# Patient Record
Sex: Female | Born: 1970 | Race: Black or African American | Hispanic: No | Marital: Single | Smoking: Never smoker
Health system: Southern US, Community
[De-identification: ages and names within clinical notes are randomized; demographics above are authoritative.]

## PROBLEM LIST (undated history)

## (undated) DIAGNOSIS — E119 Type 2 diabetes mellitus without complications: Secondary | ICD-10-CM

---

## 2019-10-20 ENCOUNTER — Emergency Department
Admission: EM | Admit: 2019-10-20 | Discharge: 2019-10-20 | Disposition: A | Discharge status: Home / Self Care | Payer: Managed Care, Other (non HMO) | Attending: Emergency Medicine | Admitting: Emergency Medicine

## 2019-10-20 ENCOUNTER — Other Ambulatory Visit: Payer: Self-pay

## 2019-10-20 ENCOUNTER — Encounter: Payer: Self-pay | Admitting: Emergency Medicine

## 2019-10-20 ENCOUNTER — Emergency Department: Payer: Managed Care, Other (non HMO)

## 2019-10-20 DIAGNOSIS — R569 Unspecified convulsions: Secondary | ICD-10-CM | POA: Diagnosis not present

## 2019-10-20 HISTORY — DX: Type 2 diabetes mellitus without complications: E11.9

## 2019-10-20 LAB — CBC WITH DIFFERENTIAL/PLATELET
Abs Immature Granulocytes: 0.04 10*3/uL (ref 0.00–0.07)
Basophils Absolute: 0 10*3/uL (ref 0.0–0.1)
Basophils Relative: 0 %
Eosinophils Absolute: 0.2 10*3/uL (ref 0.0–0.5)
Eosinophils Relative: 2 %
HCT: 38.2 % (ref 36.0–46.0)
Hemoglobin: 11.8 g/dL — ABNORMAL LOW (ref 12.0–15.0)
Immature Granulocytes: 0 %
Lymphocytes Relative: 51 %
Lymphs Abs: 4.9 10*3/uL — ABNORMAL HIGH (ref 0.7–4.0)
MCH: 25.9 pg — ABNORMAL LOW (ref 26.0–34.0)
MCHC: 30.9 g/dL (ref 30.0–36.0)
MCV: 84 fL (ref 80.0–100.0)
Monocytes Absolute: 0.9 10*3/uL (ref 0.1–1.0)
Monocytes Relative: 9 %
Neutro Abs: 3.7 10*3/uL (ref 1.7–7.7)
Neutrophils Relative %: 38 %
Platelets: 246 10*3/uL (ref 150–400)
RBC: 4.55 MIL/uL (ref 3.87–5.11)
RDW: 15.3 % (ref 11.5–15.5)
WBC: 9.6 10*3/uL (ref 4.0–10.5)
nRBC: 0 % (ref 0.0–0.2)

## 2019-10-20 LAB — COMPREHENSIVE METABOLIC PANEL
ALT: 23 U/L (ref 0–44)
AST: 31 U/L (ref 15–41)
Albumin: 4.2 g/dL (ref 3.5–5.0)
Alkaline Phosphatase: 81 U/L (ref 38–126)
Anion gap: 15 (ref 5–15)
BUN: 15 mg/dL (ref 6–20)
CO2: 22 mmol/L (ref 22–32)
Calcium: 9.5 mg/dL (ref 8.9–10.3)
Chloride: 105 mmol/L (ref 98–111)
Creatinine, Ser: 0.89 mg/dL (ref 0.44–1.00)
GFR calc Af Amer: >60 mL/min (ref >60)
GFR calc non Af Amer: >60 mL/min (ref >60)
Glucose, Bld: 112 mg/dL — ABNORMAL HIGH (ref 70–99)
Potassium: 3.9 mmol/L (ref 3.5–5.1)
Sodium: 142 mmol/L (ref 135–145)
Total Bilirubin: 0.4 mg/dL (ref 0.3–1.2)
Total Protein: 7.5 g/dL (ref 6.5–8.1)

## 2019-10-20 LAB — ETHANOL: Alcohol, Ethyl (B): <10 mg/dL (ref <10)

## 2019-10-20 LAB — GLUCOSE, CAPILLARY: Glucose-Capillary: 92 mg/dL (ref 70–99)

## 2019-10-20 NOTE — ED Provider Notes (Signed)
  ER Provider Note       Time seen: 4:40 PM   Level V caveat: History/ROS limited by altered mental status I have reviewed the vital signs and the nursing notes.  HISTORY   Chief Complaint No chief complaint on file.    HPI Carrie French is a 49 y.o. female with no known past medical history who presents today for seizure-like activity.  Patient was sleeping in the back of a vehicle driven by her sister and her sister's husband.  She was thought to be having a seizure.  Family noted that she was drooling a lot, she had bitten her lower lip.  It is unknown if she has a history of seizures.  No other information is available.  No past medical history on file.  Allergies Patient has no allergy information on record.  Review of Systems Review of systems otherwise unknown Neurological: Positive for possible seizure  All systems negative/normal/unremarkable except as stated in the HPI  ____________________________________________   PHYSICAL EXAM:  VITAL SIGNS: There were no vitals filed for this visit.  Constitutional: Alert but disoriented.  No obvious distress Eyes: Conjunctivae are normal. Normal extraocular movements. ENT      Head: Normocephalic and atraumatic.      Nose: No congestion/rhinnorhea.      Mouth/Throat: Mucous membranes are moist.,  Right sided lower lip laceration is noted      Neck: No stridor. Cardiovascular: Normal rate, regular rhythm. No murmurs, rubs, or gallops. Respiratory: Normal respiratory effort without tachypnea nor retractions. Breath sounds are clear and equal bilaterally. No wheezes/rales/rhonchi. Gastrointestinal: Soft and nontender. Normal bowel sounds Musculoskeletal: Nontender with normal range of motion in extremities. No lower extremity tenderness nor edema. Neurologic:  Normal speech and language. No gross focal neurologic deficits are appreciated.  Skin:  Skin is warm, dry and intact. No rash noted. Psychiatric: Speech and  behavior are normal.  ____________________________________________   LABS (pertinent positives/negatives)  Labs Reviewed  CBC WITH DIFFERENTIAL/PLATELET - Abnormal; Notable for the following components:      Result Value   Hemoglobin 11.8 (*)    MCH 25.9 (*)    Lymphs Abs 4.9 (*)    All other components within normal limits  COMPREHENSIVE METABOLIC PANEL - Abnormal; Notable for the following components:   Glucose, Bld 112 (*)    All other components within normal limits  GLUCOSE, CAPILLARY  ETHANOL  URINALYSIS, COMPLETE (UACMP) WITH MICROSCOPIC  URINE DRUG SCREEN, QUALITATIVE ([facility] ONLY)  CBG MONITORING, ED    RADIOLOGY  Images were viewed by me CT head IMPRESSION: 1. No acute intracranial process.  DIFFERENTIAL DIAGNOSIS  Seizure, pseudoseizure, arrhythmia, syncope, dehydration, electrolyte abnormality  ASSESSMENT AND PLAN  Seizure like activity   Plan: The patient had presented for seizure-like activity. Patient's labs are unremarkable, she was unable to provide a urine specimen.  No clear etiology for potential seizure.  She will be referred to neurology for outpatient follow-up.  Daryel November MD    Note: This note was generated in part or whole with voice recognition software. Voice recognition is usually quite accurate but there are transcription errors that can and very often do occur. I apologize for any typographical errors that were not detected and corrected.      Emily Filbert, MD 10/20/19 2020

## 2019-10-20 NOTE — ED Triage Notes (Signed)
Pt from home via POV. Sister drove pt to ED, per sister pt "had a seizure while in the car and drove here". St seizure last ' ".   ED Staff pulled pt out of her POV onto stretcher and brougth back to 12H.  Pt arrives postictal with no previous hx of seizures. Bit her tongue. EDP Williams at bedside.  Pt disoriented upon arrival.

## 2021-01-08 IMAGING — CT CT HEAD W/O CM
3 series · 16 of 47 positions shown, 19 images · non-contrast
Comparison: None.

CLINICAL DATA: Encephalopathy, confusion

EXAM:
CT HEAD WITHOUT CONTRAST
TECHNIQUE: Contiguous axial images were obtained from the base of the skull
through the vertex without intravenous contrast.

[Series 2: head wo · axial · 0.47mm/px · z∈[-141,-16]mm · 10 of 30 slices shown, 13 images]
[im 3/30  brain]
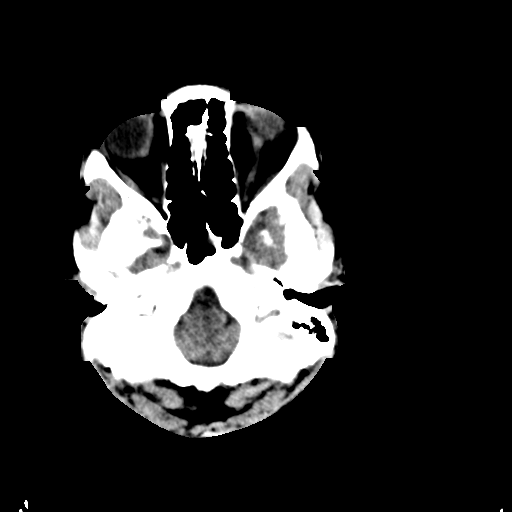
[im 3/30  bone]
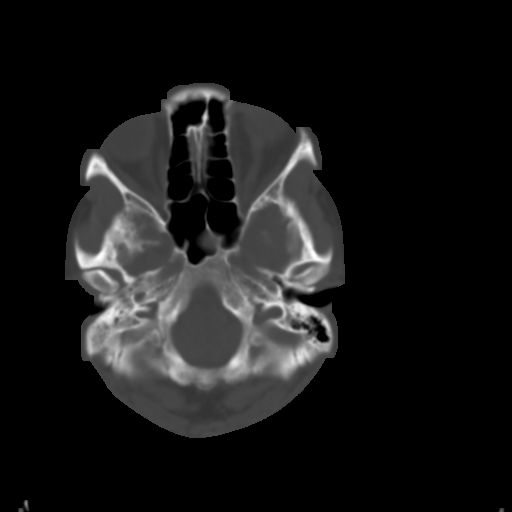
[im 6/30  brain]
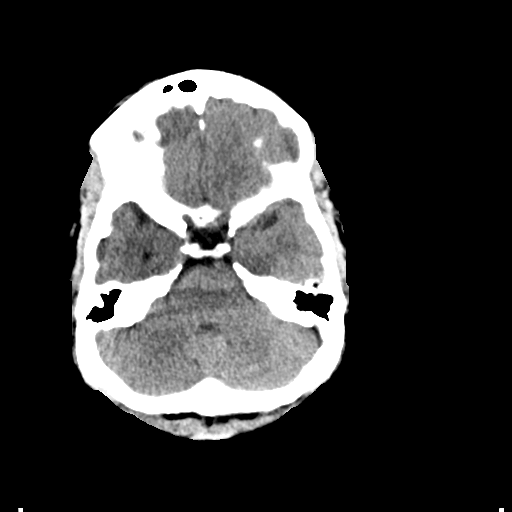
[im 9/30  brain]
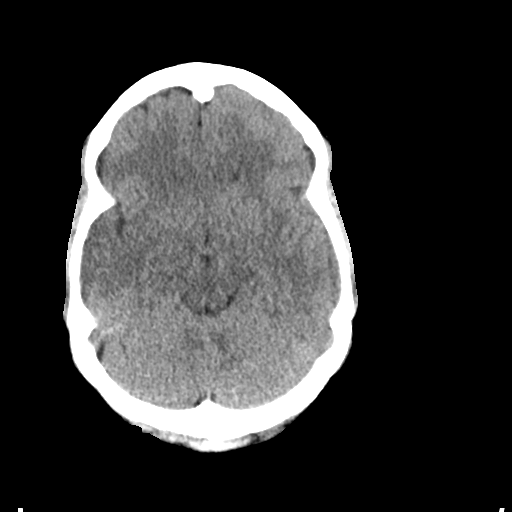
[im 11/30  brain]
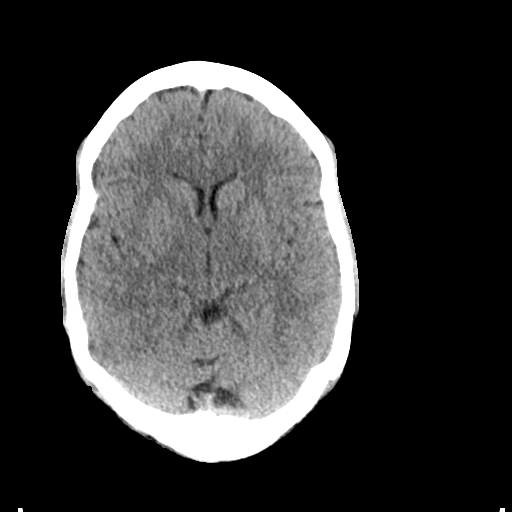
[im 14/30  brain]
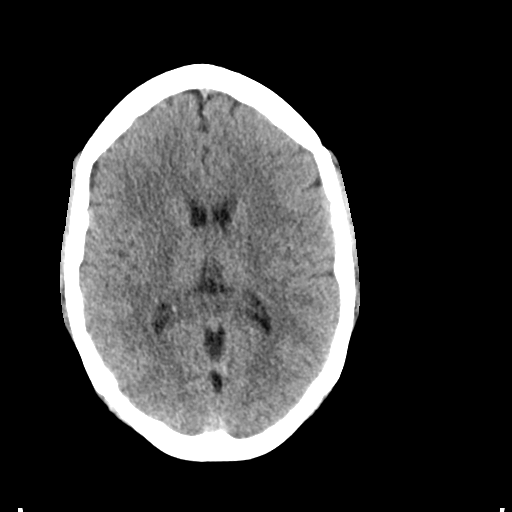
[im 14/30  bone]
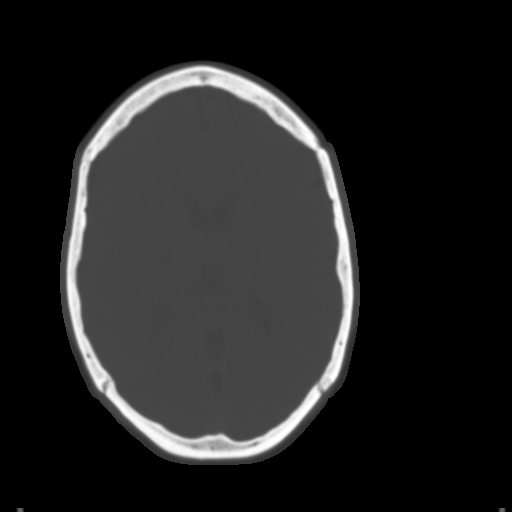
[im 17/30  brain]
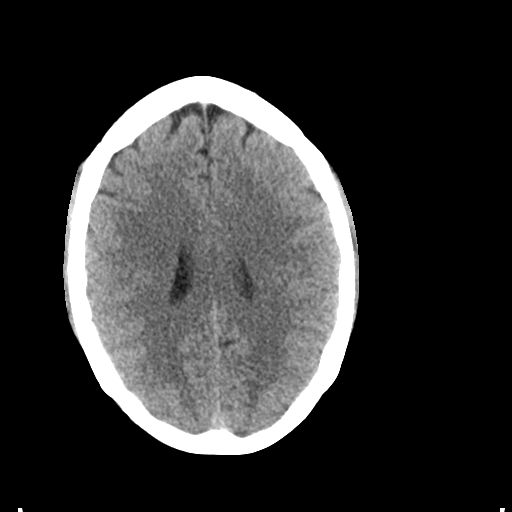
[im 20/30  brain]
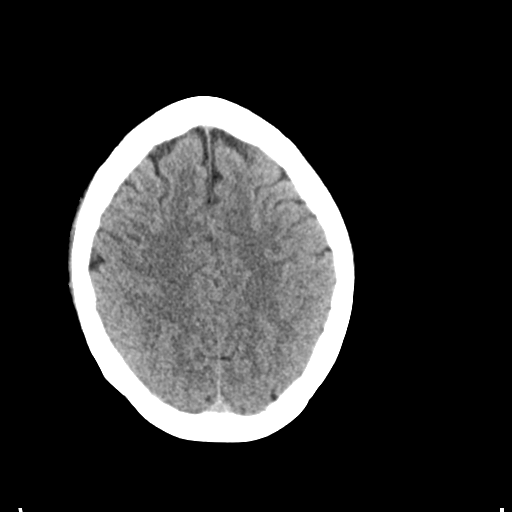
[im 23/30  brain]
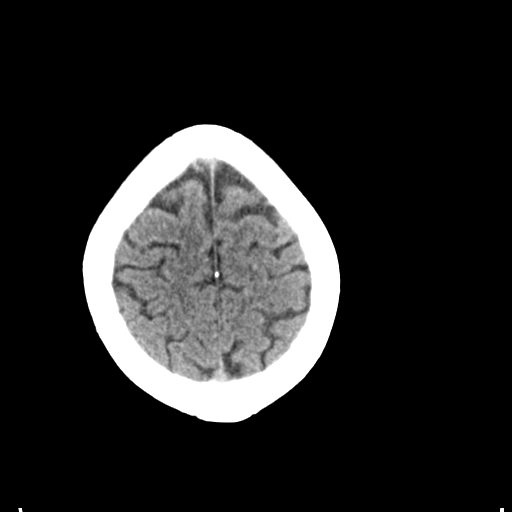
[im 25/30  brain]
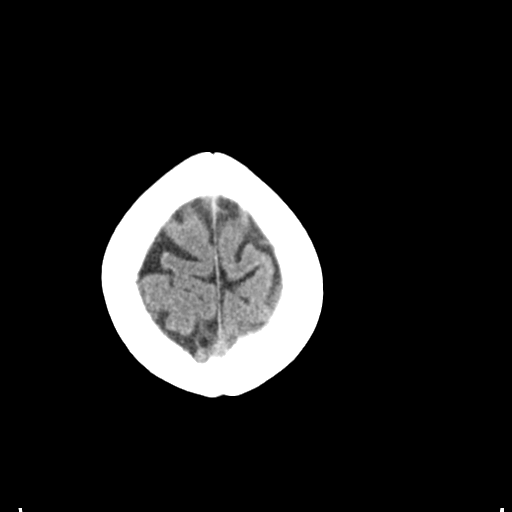
[im 25/30  bone]
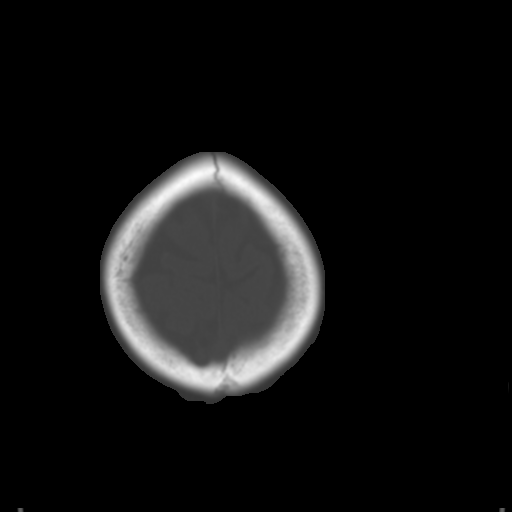
[im 28/30  brain]
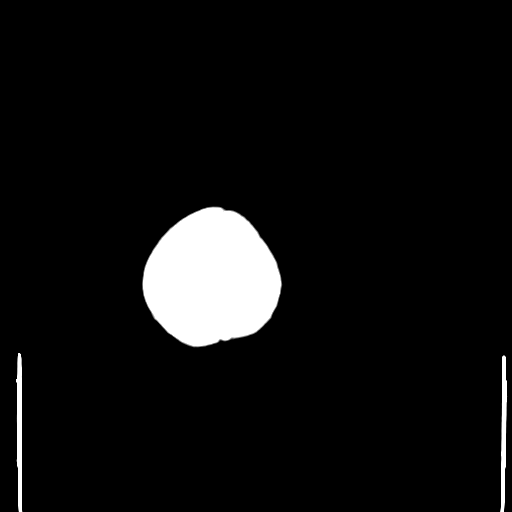

[Series 4: coronal soft tissue · coronal · 0.31mm/px · 3 of 66 slices shown]
[im 22/66  brain]
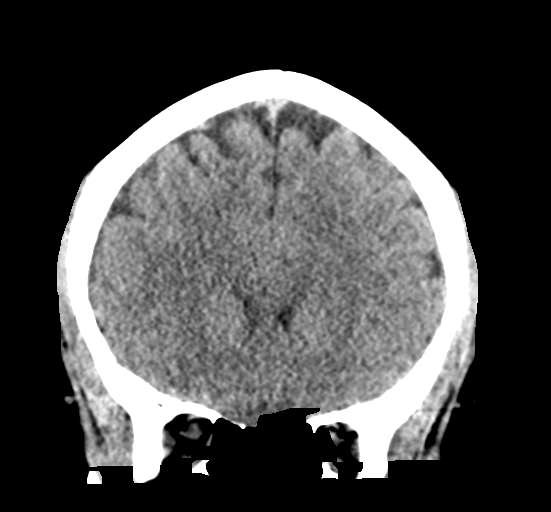
[im 29/66  brain]
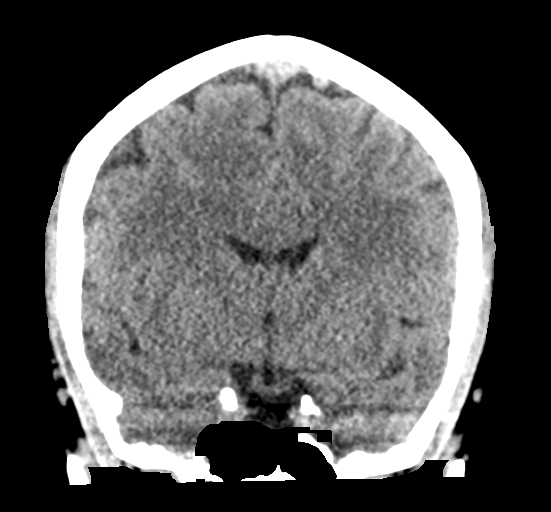
[im 37/66  brain]
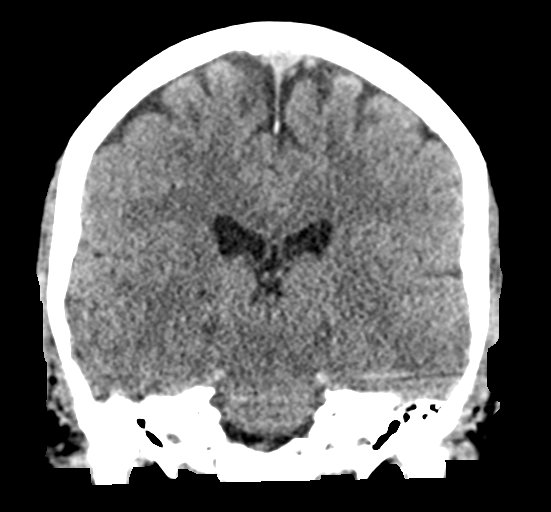

[Series 5: sagittal soft tissue · sagittal · 0.31mm/px · 3 of 57 slices shown]
[im 19/57  brain]
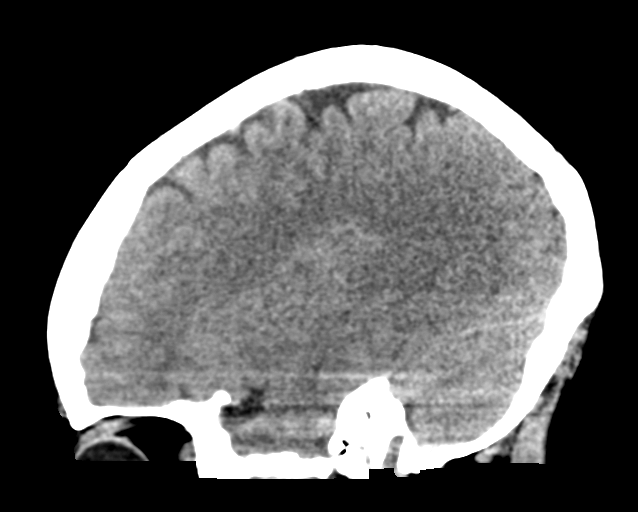
[im 29/57  brain]
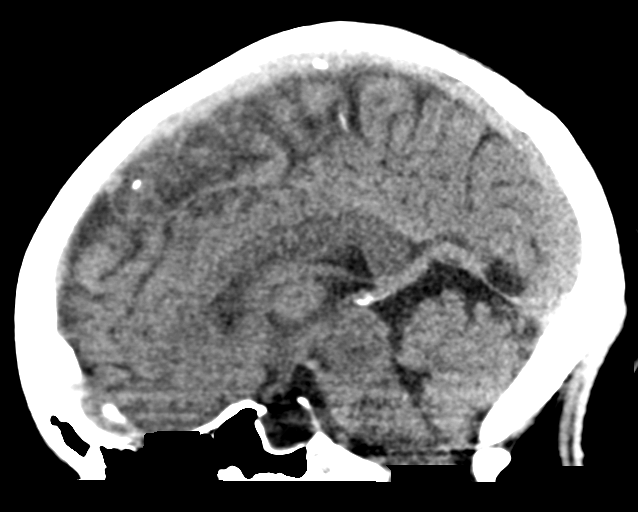
[im 38/57  brain]
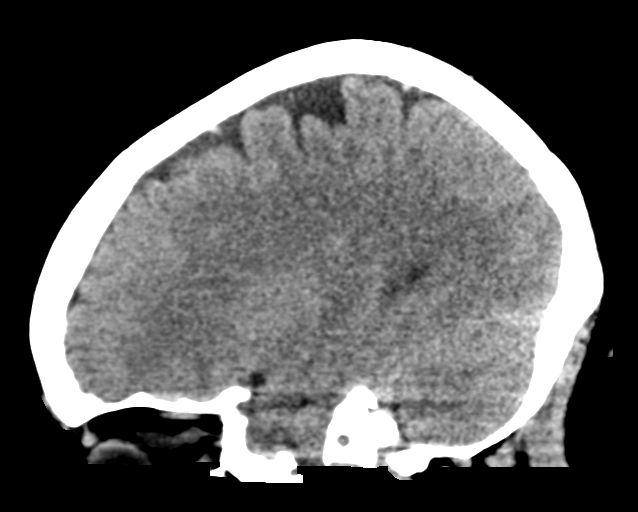

[16 of 47 positions shown; findings below may reference images not displayed]

FINDINGS: Brain: No acute infarct or hemorrhage. Lateral ventricles and
midline structures are unremarkable. No acute extra-axial fluid
collections. No mass effect.

Vascular: No hyperdense vessel or unexpected calcification.

Skull: Normal. Negative for fracture or focal lesion.

Sinuses/Orbits: No acute finding.

Other: None.
IMPRESSION: 1. No acute intracranial process.
